# Patient Record
Sex: Female | Born: 1955
Health system: Southern US, Community
[De-identification: ages and names within clinical notes are randomized; demographics above are authoritative.]

---

## 1999-07-11 ENCOUNTER — Other Ambulatory Visit: Admission: RE | Admit: 1999-07-11 | Discharge: 1999-07-11 | Payer: Self-pay | Admitting: *Deleted

## 2000-08-27 ENCOUNTER — Other Ambulatory Visit: Admission: RE | Admit: 2000-08-27 | Discharge: 2000-08-27 | Payer: Self-pay | Admitting: *Deleted

## 2001-09-24 ENCOUNTER — Other Ambulatory Visit: Admission: RE | Admit: 2001-09-24 | Discharge: 2001-09-24 | Payer: Self-pay | Admitting: *Deleted

## 2003-08-26 ENCOUNTER — Other Ambulatory Visit: Admission: RE | Admit: 2003-08-26 | Discharge: 2003-08-26 | Payer: Self-pay | Admitting: Obstetrics and Gynecology

## 2013-02-19 ENCOUNTER — Encounter (HOSPITAL_COMMUNITY): Payer: Self-pay | Admitting: Emergency Medicine

## 2013-02-19 ENCOUNTER — Emergency Department (HOSPITAL_COMMUNITY)
Admission: EM | Admit: 2013-02-19 | Discharge: 2013-02-19 | Disposition: A | Discharge status: Home / Self Care | Payer: No Typology Code available for payment source | Attending: Emergency Medicine | Admitting: Emergency Medicine

## 2013-02-19 DIAGNOSIS — Z79899 Other long term (current) drug therapy: Secondary | ICD-10-CM | POA: Insufficient documentation

## 2013-02-19 DIAGNOSIS — Y92009 Unspecified place in unspecified non-institutional (private) residence as the place of occurrence of the external cause: Secondary | ICD-10-CM | POA: Insufficient documentation

## 2013-02-19 DIAGNOSIS — R296 Repeated falls: Secondary | ICD-10-CM | POA: Insufficient documentation

## 2013-02-19 DIAGNOSIS — Y9389 Activity, other specified: Secondary | ICD-10-CM | POA: Insufficient documentation

## 2013-02-19 DIAGNOSIS — M549 Dorsalgia, unspecified: Secondary | ICD-10-CM

## 2013-02-19 DIAGNOSIS — IMO0002 Reserved for concepts with insufficient information to code with codable children: Secondary | ICD-10-CM | POA: Insufficient documentation

## 2013-02-19 LAB — URINALYSIS, ROUTINE W REFLEX MICROSCOPIC
Bilirubin Urine: NEGATIVE
Glucose, UA: NEGATIVE mg/dL
Hgb urine dipstick: NEGATIVE
Ketones, ur: NEGATIVE mg/dL
Protein, ur: NEGATIVE mg/dL
Urobilinogen, UA: 0.2 mg/dL (ref 0.0–1.0)

## 2013-02-19 LAB — URINE MICROSCOPIC-ADD ON

## 2013-02-19 MED ORDER — IBUPROFEN 600 MG PO TABS: 600.0000 mg | ORAL_TABLET | Freq: Four times a day (QID) | ORAL | Status: DC | PRN

## 2013-02-19 MED ORDER — METHOCARBAMOL 500 MG PO TABS: 500.0000 mg | ORAL_TABLET | Freq: Two times a day (BID) | ORAL | Status: DC

## 2013-02-19 MED ORDER — TRAMADOL HCL 50 MG PO TABS: 50.0000 mg | ORAL_TABLET | Freq: Four times a day (QID) | ORAL | Status: DC | PRN

## 2013-02-19 NOTE — ED Notes (Signed)
Patient states she was awaken at 3am with c/o lower back pain states pain was so severe she became sweaty and diaphortic. States she took 4 ibruprofen and actually now feels much better.

## 2013-02-19 NOTE — ED Notes (Addendum)
PT. REPORTS LEFT LOWER BACK PAIN / STIFFNESS ONSET THIS MORNING , FELL FORWARD WHILE PULLING A SHRUB 1 WEEK AGO , DENIES URINARY DISCOMFORT OR HEMATURIA . TOOK IBUPROFEN WITH TEMPORARY RELIEF.

## 2013-02-19 NOTE — ED Provider Notes (Signed)
Medical screening examination/treatment/procedure(s) were performed by non-physician practitioner and as supervising physician I was immediately available for consultation/collaboration.  Olivia Mackie, MD 02/19/13 [phone]

## 2013-02-19 NOTE — ED Provider Notes (Signed)
CSN: [id number]     Arrival date & time 02/19/13  0405 History     First MD Initiated Contact with Patient 02/19/13 0606     Chief Complaint  Patient presents with  . Back Pain   (Consider location/radiation/quality/duration/timing/severity/associated sxs/prior Treatment) HPI Patient is a 57 year old female who presented to emergency department complaining of back pain. Patient states she fell about a week ago while doing some yard work. States at that time mild pain in the left lower back. States since the fall she has experienced increased pain in the left lower back in the mornings only. States usually when wakes up since 6 AM her back pain is severe. States it improves as the day goes on. States she's taking ibuprofen but good relief. Patient admits that she's been playing tennis with injured back which she states "maybe I shouldn't be." Patient states that the pain does not radiate into her extremities. Patient denies any weakness or numbness in extremities. Patient denies any settle anesthesia. Patient denies any problems with urine or bowel control. Patient denies any fever, chills, malaise. Patient denies any abdominal pain. States this morning was waking up from sleep at 2 AM with increased pain. States at that time she had trouble moving and states she panicked and came to emergency department. Prior to coming in she took 800 mg of ibuprofen and her pain has resolved since she's been here. History reviewed. No pertinent past medical history. Past Surgical History  Procedure Laterality Date  . Cesarean section     No family history on file. History  Substance Use Topics  . Smoking status: Never Smoker   . Smokeless tobacco: Not on file  . Alcohol Use: No   OB History   Grav Para Term Preterm Abortions TAB SAB Ect Mult Living                 Review of Systems  Constitutional: Negative for fever and chills.  HENT: Negative for neck pain and neck stiffness.   Respiratory:  Negative.   Cardiovascular: Negative.   Genitourinary: Negative for dysuria, hematuria, flank pain, decreased urine volume, vaginal bleeding, vaginal discharge and pelvic pain.  Musculoskeletal: Negative for back pain.  Skin: Negative.   Neurological: Negative for weakness and numbness.    Allergies  Review of patient's allergies indicates no known allergies.  Home Medications   Current Outpatient Rx  Name  Route  Sig  Dispense  Refill  . buPROPion (WELLBUTRIN XL) 150 MG 24 hr tablet   Oral   Take 150 mg by mouth daily.         . cetirizine (ZYRTEC) 10 MG tablet   Oral   Take 10 mg by mouth daily.          BP 142/64  Temp(Src) 97.9 F (36.6 C) (Oral)  Resp 18  SpO2 100% Physical Exam  Nursing note and vitals reviewed. Constitutional: She appears well-developed and well-nourished. No distress.  HENT:  Head: Normocephalic.  Eyes: Conjunctivae are normal.  Neck: Neck supple.  Cardiovascular: Normal rate, regular rhythm and normal heart sounds.   Pulmonary/Chest: Effort normal and breath sounds normal. No respiratory distress. She has no wheezes. She has no rales.  Abdominal: Soft. Bowel sounds are normal. She exhibits no distension. There is no tenderness. There is no rebound.  Musculoskeletal: She exhibits no edema.  No midline or paravertebral lumbar spine tenderness. No CVA tenderness. Pain with left straight leg raise.  Neurological: She is alert.  5/5 and  equal lower extremity strength. 2+ and equal patellar reflexes bilaterally. Pt able to dorsiflex bilateral toes and feet with good strength against resistance. Equal sensation bilaterally over thighs and lower legs. Normal gait  Skin: Skin is warm and dry.  Psychiatric: She has a normal mood and affect. Her behavior is normal.    ED Course   Procedures (including critical care time)  Labs Reviewed  URINALYSIS, ROUTINE W REFLEX MICROSCOPIC - Abnormal; Notable for the following:    APPearance CLOUDY (*)     Leukocytes, UA MODERATE (*)    All other components within normal limits  URINE MICROSCOPIC-ADD ON - Abnormal; Notable for the following:    Crystals CA OXALATE CRYSTALS (*)    All other components within normal limits  URINE CULTURE   No results found.  1. Back pain     MDM  Patient's back pain is most likely musculoskeletal based on the exam and the fact that she has worse pain with movement. Patient's pain has resolved with ibuprofen. Have discussed with patient possibility of a kidney stone, she denies any history of kidney stones. Time she is pain-free and does not wish to have anymore testing and wants to go home. UA does show moderate leukocyte esterase 7-10 white blood cells and calcium oxalate crystals. This is consistent with possible kidney stone versus infection. There are rare bacteria in urine. Cultures are sounds and is abnormal she will be called in an antibiotic. At this time patient just wants to be discharged home. Will try a muscle relaxant and pain medication and home with close followup.  Lottie Mussel, PA-C 02/19/13 [phone]

## 2013-02-20 LAB — URINE CULTURE
Colony Count: NO GROWTH
Culture: NO GROWTH

## 2015-07-21 ENCOUNTER — Ambulatory Visit (INDEPENDENT_AMBULATORY_CARE_PROVIDER_SITE_OTHER): Payer: 59 | Admitting: Podiatry

## 2015-07-21 ENCOUNTER — Encounter: Payer: Self-pay | Admitting: Podiatry

## 2015-07-21 VITALS — BP 162/88 | HR 77 | Resp 16

## 2015-07-21 DIAGNOSIS — L603 Nail dystrophy: Secondary | ICD-10-CM

## 2015-07-21 NOTE — Progress Notes (Signed)
   Subjective:    Patient ID: Alexis Bullock, female    DOB: [birth year], 60 y.o.   MRN: [id number]  HPI: She presents today with a chief complaint of possible fungus to the hallux and fifth digit of the right foot. She states that this is been present for several years. Has seen Dr. Sherilyn Cooter in the past for fungus to the bilateral foot and was provided a prescription for Lamisil which cleared the left foot but did nothing for the right foot.    Review of Systems  All other systems reviewed and are negative.      Objective:   Physical Exam: 60 year old female vital signs stable alert and oriented 3. Pulses are palpable. Neurologic sensorium is intact. Deep tendon reflexes are intact bilateral and muscle strength +5 over 5 dorsiflexion plantar flexors and inverters everters all intrinsic musculature is intact. Orthopedic evaluation demonstrate all joints distal to the ankle have a full range of motion without crepitation. Cutaneous evaluation demonstrates supple well-hydrated cutis no erythema edema cellulitis drainage or odor. The nail plates do demonstrate nail dystrophy and some possible onychomycosis. There does seem to be some subungual debris but no delamination.        Assessment & Plan:  Nail dystrophy hallux and fifth digits right foot.  Plan: Samples of the skin and nail were taken today to be sent for pathologic evaluation. We will notify her with the results as they return.

## 2015-08-10 ENCOUNTER — Telehealth: Payer: Self-pay | Admitting: *Deleted

## 2015-08-10 NOTE — Telephone Encounter (Addendum)
Pt states Dr. Al Corpus recommended she call our office 3 weeks after last visit for fungal culture results.  I faxed results to Dr. Al Corpus. 08/10/2015-DR. HYATT REVIEWED 07/21/2015 fungal culture as negative for fungus and recommended Revitaderm40.  Informed pt of results and recommendation and pt states she will pick up the Revitaderm40 tomorrow.

## 2015-08-12 DIAGNOSIS — L603 Nail dystrophy: Secondary | ICD-10-CM

## 2016-04-19 DIAGNOSIS — J069 Acute upper respiratory infection, unspecified: Secondary | ICD-10-CM | POA: Diagnosis not present

## 2016-05-30 DIAGNOSIS — Z23 Encounter for immunization: Secondary | ICD-10-CM | POA: Diagnosis not present

## 2016-09-27 DIAGNOSIS — R350 Frequency of micturition: Secondary | ICD-10-CM | POA: Diagnosis not present

## 2016-10-03 DIAGNOSIS — N202 Calculus of kidney with calculus of ureter: Secondary | ICD-10-CM | POA: Diagnosis not present

## 2016-10-03 DIAGNOSIS — N200 Calculus of kidney: Secondary | ICD-10-CM | POA: Diagnosis not present

## 2016-12-25 DIAGNOSIS — Z0000 Encounter for general adult medical examination without abnormal findings: Secondary | ICD-10-CM | POA: Diagnosis not present

## 2016-12-26 DIAGNOSIS — Z0000 Encounter for general adult medical examination without abnormal findings: Secondary | ICD-10-CM | POA: Diagnosis not present

## 2016-12-26 DIAGNOSIS — Z13220 Encounter for screening for lipoid disorders: Secondary | ICD-10-CM | POA: Diagnosis not present

## 2017-02-05 ENCOUNTER — Ambulatory Visit (INDEPENDENT_AMBULATORY_CARE_PROVIDER_SITE_OTHER): Payer: Self-pay

## 2017-02-05 ENCOUNTER — Ambulatory Visit (INDEPENDENT_AMBULATORY_CARE_PROVIDER_SITE_OTHER): Payer: BLUE CROSS/BLUE SHIELD | Admitting: Orthopedic Surgery

## 2017-02-05 VITALS — Ht 65.0 in | Wt 145.0 lb

## 2017-02-05 DIAGNOSIS — G8929 Other chronic pain: Secondary | ICD-10-CM

## 2017-02-05 DIAGNOSIS — M25511 Pain in right shoulder: Secondary | ICD-10-CM

## 2017-02-05 MED ORDER — METHYLPREDNISOLONE ACETATE 40 MG/ML IJ SUSP
40.0000 mg | INTRAMUSCULAR | Status: AC | PRN
  Administered 2017-02-05: 40 mg via INTRA_ARTICULAR

## 2017-02-05 MED ORDER — LIDOCAINE HCL 1 % IJ SOLN
5.0000 mL | INTRAMUSCULAR | Status: AC | PRN
  Administered 2017-02-05: 5 mL

## 2017-02-05 NOTE — Progress Notes (Signed)
Office Visit Note   Patient: Alexis Bullock           Date of Birth: [birth year]           MRN: [id number] Visit Date: 02/05/2017              Requested by: Merri Brunette, MD [id number] [place], [place] [address] PCP: Merri Brunette, MD  Chief Complaint  Patient presents with  . Right Shoulder - Pain    S/p fall about 6 weeks ago      HPI: Patient is a 61 year old woman who has been having impingement symptoms of her right shoulder she has pain with trying to get her arm overhead wall playing tennis pain at night pain radiates down into the biceps region. Patient states she initially fell on an outstretched arm and this is what caused her problems. Patient has undergone months of conservative therapy without relief of her symptoms.  Assessment & Plan: Visit Diagnoses:  1. Chronic right shoulder pain     Plan: Shoulder was injected from the posterior portal follow-up as needed. Discussed that if she has recurrent symptoms we can proceed with an additional injection if the injection provides no relief we would obtain an MRI scan to further evaluate the integrity of the rotator cuff.  Follow-Up Instructions: Return if symptoms worsen or fail to improve.   Ortho Exam  Patient is alert, oriented, no adenopathy, well-dressed, normal affect, normal respiratory effort. Examination patient has a normal gait. She has abduction and flexion 120. She has pain with Neer and Hawkins impingement test pain with drop arm test. The biceps tendon is nontender to palpation.  Imaging: Xr Shoulder Right  Result Date: 02/05/2017 Three-view radiographs of the right shoulder shows decrease of joint space narrowing slightly hoped acromion with a congruent glenohumeral joint. No masses in the lung field.   Labs: Lab Results  Component Value Date   REPTSTATUS 02/20/2013 FINAL 02/19/2013   CULT NO GROWTH Performed at [facility] 02/19/2013    Orders:  Orders Placed This  Encounter  Procedures  . XR Shoulder Right   No orders of the defined types were placed in this encounter.    Procedures: Large Joint Inj Date/Time: 02/05/2017 2:35 PM Performed by: Terreon Ekholm V Authorized by: Nadara Mustard   Consent Given by:  Patient Site marked: the procedure site was marked   Timeout: prior to procedure the correct patient, procedure, and site was verified   Indications:  Pain and diagnostic evaluation Location:  Shoulder Site:  R subacromial bursa Prep: patient was prepped and draped in usual sterile fashion   Needle Size:  22 G Needle Length:  1.5 inches Approach:  Posterior Ultrasound Guidance: No   Fluoroscopic Guidance: No   Arthrogram: No   Medications:  5 mL lidocaine 1 %; 40 mg methylPREDNISolone acetate 40 MG/ML Aspiration Attempted: No   Patient tolerance:  Patient tolerated the procedure well with no immediate complications    Clinical Data: No additional findings.  ROS:  All other systems negative, except as noted in the HPI. Review of Systems  Objective: Vital Signs: Ht 5\' 5"  (1.651 m)   Wt 145 lb (65.8 kg)   BMI 24.13 kg/m   Specialty Comments:  No specialty comments available.  PMFS History: There are no active problems to display for this patient.  No past medical history on file.  No family history on file.  Past Surgical History:  Procedure Laterality Date  .  CESAREAN SECTION     Social History   Occupational History  . Not on file.   Social History Main Topics  . Smoking status: Never Smoker  . Smokeless tobacco: Not on file  . Alcohol use No  . Drug use: No  . Sexual activity: Not on file

## 2017-03-11 ENCOUNTER — Telehealth (INDEPENDENT_AMBULATORY_CARE_PROVIDER_SITE_OTHER): Payer: Self-pay

## 2017-03-11 DIAGNOSIS — G8929 Other chronic pain: Secondary | ICD-10-CM

## 2017-03-11 DIAGNOSIS — M25511 Pain in right shoulder: Principal | ICD-10-CM

## 2017-03-11 NOTE — Telephone Encounter (Addendum)
Patient called stating that she is having Bicep and Tricep pain in her right arm.  Would like to know if she could get a Rx for pain?  CB# is [phone].  Please advise. Thank You.

## 2017-03-12 MED ORDER — NABUMETONE 750 MG PO TABS: 750.0000 mg | ORAL_TABLET | Freq: Two times a day (BID) | ORAL | 0 refills | Status: DC

## 2017-03-12 NOTE — Addendum Note (Signed)
Addended by: Diona FoleyPEELE, STEPHENEY L on: 03/12/2017 10:14 AM   Modules accepted: Orders

## 2017-03-12 NOTE — Telephone Encounter (Signed)
I called and spoke with patient advised we would send in relafen to her pharmacy and order MRI. Advised we would need to obtain insurance approval before she can be scheduled. She is changing insurance on 04/01/17 with her employer. Advised hopefully we can have her scheduled before this date. She is claustrophobic and requests an open scanner.

## 2017-03-12 NOTE — Telephone Encounter (Signed)
Call patient we can: Prescription for Relafen 750 mg twice a day and set her up for an MRI scan of her shoulder to rule out rotator cuff tear.

## 2017-03-13 ENCOUNTER — Ambulatory Visit (INDEPENDENT_AMBULATORY_CARE_PROVIDER_SITE_OTHER): Payer: BLUE CROSS/BLUE SHIELD | Admitting: Orthopedic Surgery

## 2017-03-18 DIAGNOSIS — Z1231 Encounter for screening mammogram for malignant neoplasm of breast: Secondary | ICD-10-CM | POA: Diagnosis not present

## 2017-03-27 DIAGNOSIS — Z6825 Body mass index (BMI) 25.0-25.9, adult: Secondary | ICD-10-CM | POA: Diagnosis not present

## 2017-03-27 DIAGNOSIS — Z01419 Encounter for gynecological examination (general) (routine) without abnormal findings: Secondary | ICD-10-CM | POA: Diagnosis not present

## 2017-04-03 ENCOUNTER — Ambulatory Visit
Admission: RE | Admit: 2017-04-03 | Discharge: 2017-04-03 | Disposition: A | Discharge status: Home / Self Care | Payer: BLUE CROSS/BLUE SHIELD | Source: Ambulatory Visit | Attending: Orthopedic Surgery | Admitting: Orthopedic Surgery

## 2017-04-03 DIAGNOSIS — M25511 Pain in right shoulder: Principal | ICD-10-CM

## 2017-04-03 DIAGNOSIS — G8929 Other chronic pain: Secondary | ICD-10-CM

## 2017-04-04 ENCOUNTER — Encounter (INDEPENDENT_AMBULATORY_CARE_PROVIDER_SITE_OTHER): Payer: Self-pay | Admitting: Orthopedic Surgery

## 2017-04-04 ENCOUNTER — Ambulatory Visit (INDEPENDENT_AMBULATORY_CARE_PROVIDER_SITE_OTHER): Payer: BLUE CROSS/BLUE SHIELD | Admitting: Orthopedic Surgery

## 2017-04-04 DIAGNOSIS — M7541 Impingement syndrome of right shoulder: Secondary | ICD-10-CM

## 2017-04-04 NOTE — Progress Notes (Signed)
Office Visit Note   Patient: Alexis Bullock           Date of Birth: Feb 22, 1956           MRN: [id number] Visit Date: 04/04/2017              Requested by: Merri Brunette, MD [phone] [place], [place] [address] PCP: Merri Brunette, MD  Chief Complaint  Patient presents with  . Results    Rt shoulder MRI      HPI: Patient is a 61 year old woman who has not played tennis since her last examination she states her shoulder is feeling fine she has been doing scapular stabilization exercises but no others. She states the pain is primarily in the biceps region she states she can sleep now.  Assessment & Plan: Visit Diagnoses:  1. Impingement syndrome of right shoulder     Plan: Recommended internal and external rotation strengthening and scapular stabilization. This was demonstrated to her. She states she will resume tennis and follow-up as needed.  Follow-Up Instructions: Return if symptoms worsen or fail to improve.   Ortho Exam  Patient is alert, oriented, no adenopathy, well-dressed, normal affect, normal respiratory effort. Examination patient has full range of motion of both shoulders she has no pain with Neer and Hawkins impingement test biceps tendon is nontender to palpation. Review of the MRI scan shows a tiny partial-thickness tearing of the rotator cuff very mild subacromial bursitis.  Imaging: Mr Shoulder Right Wo Contrast  Result Date: 04/03/2017 CLINICAL DATA:  Right shoulder pain since a fall 3 months ago. EXAM: MRI OF THE RIGHT SHOULDER WITHOUT CONTRAST TECHNIQUE: Multiplanar, multisequence MR imaging of the shoulder was performed. No intravenous contrast was administered. COMPARISON:  None. FINDINGS: Rotator cuff: There is a 3 x 5 mm partial-thickness articular surface tear of the distal supraspinous tendon is seen on image 10 of series 6 and image 14 of series 4. The rotator cuff otherwise appears normal. Muscles: No atrophy or abnormal signal of  the muscles of the rotator cuff. Biceps long head:  Properly located and intact. Acromioclavicular Joint: Normal. Type 1 acromion. Tiny amount of fluid in the subacromial bursa. Glenohumeral Joint: Normal. Labrum:  Normal. Bones:  Normal. Other: None IMPRESSION: 1. Tiny focal articular surface tear of the distal supraspinous tendon. 2. Minimal subacromial bursitis. Electronically Signed   By: Francene Boyers M.D.   On: 04/03/2017 11:33   No images are attached to the encounter.  Labs: Lab Results  Component Value Date   REPTSTATUS 02/20/2013 FINAL 02/19/2013   CULT NO GROWTH Performed at [facility] 02/19/2013    Orders:  No orders of the defined types were placed in this encounter.  No orders of the defined types were placed in this encounter.    Procedures: No procedures performed  Clinical Data: No additional findings.  ROS:  All other systems negative, except as noted in the HPI. Review of Systems  Objective: Vital Signs: There were no vitals taken for this visit.  Specialty Comments:  No specialty comments available.  PMFS History: There are no active problems to display for this patient.  No past medical history on file.  No family history on file.  Past Surgical History:  Procedure Laterality Date  . CESAREAN SECTION     Social History   Occupational History  . Not on file.   Social History Main Topics  . Smoking status: Never Smoker  . Smokeless tobacco: Never Used  .  Alcohol use No  . Drug use: No  . Sexual activity: Not on file

## 2017-04-07 ENCOUNTER — Other Ambulatory Visit (INDEPENDENT_AMBULATORY_CARE_PROVIDER_SITE_OTHER): Payer: Self-pay | Admitting: Orthopedic Surgery

## 2017-04-09 DIAGNOSIS — M8589 Other specified disorders of bone density and structure, multiple sites: Secondary | ICD-10-CM | POA: Diagnosis not present

## 2017-05-06 DIAGNOSIS — R3915 Urgency of urination: Secondary | ICD-10-CM | POA: Diagnosis not present

## 2017-11-04 DIAGNOSIS — L237 Allergic contact dermatitis due to plants, except food: Secondary | ICD-10-CM | POA: Diagnosis not present

## 2017-11-12 ENCOUNTER — Ambulatory Visit: Payer: BLUE CROSS/BLUE SHIELD | Admitting: Podiatry

## 2017-11-12 ENCOUNTER — Other Ambulatory Visit: Payer: Self-pay | Admitting: Podiatry

## 2017-11-12 ENCOUNTER — Encounter: Payer: Self-pay | Admitting: Podiatry

## 2017-11-12 ENCOUNTER — Ambulatory Visit: Payer: BLUE CROSS/BLUE SHIELD

## 2017-11-12 ENCOUNTER — Other Ambulatory Visit: Payer: Self-pay

## 2017-11-12 DIAGNOSIS — M21621 Bunionette of right foot: Secondary | ICD-10-CM | POA: Diagnosis not present

## 2017-11-12 DIAGNOSIS — M2011 Hallux valgus (acquired), right foot: Secondary | ICD-10-CM

## 2017-11-12 NOTE — Progress Notes (Signed)
She presents today after having not seen her for a couple years with a chief concern of a painful knot to the dorsal lateral aspect of the fifth metatarsal phalangeal joint of the right foot.  States that she wore a pair shoes one day and after that the foot was sore in that area but has since calmed down considerably.  Objective: Vital signs are stable she is alert and oriented x3 palpable pulses right foot.  Neurologic sensorium is intact.  Evaluation of fifth metatarsophalangeal joint area does demonstrate a mild tailor's bunion deformity.  No erythema sialitis drainage or odor no pain on palpation of the area or range of motion of the fifth toe.  Assessment: Capsulitis and tailor's bunion deformity.  Plan: Discussed etiology pathology conservative surgical therapy at this point told her to follow-up with me should this recur or become more painful we will put her shot injury at that time.

## 2017-12-10 DIAGNOSIS — L03115 Cellulitis of right lower limb: Secondary | ICD-10-CM | POA: Diagnosis not present

## 2017-12-10 DIAGNOSIS — W57XXXA Bitten or stung by nonvenomous insect and other nonvenomous arthropods, initial encounter: Secondary | ICD-10-CM | POA: Diagnosis not present

## 2017-12-30 DIAGNOSIS — E7800 Pure hypercholesterolemia, unspecified: Secondary | ICD-10-CM | POA: Diagnosis not present

## 2017-12-30 DIAGNOSIS — N649 Disorder of breast, unspecified: Secondary | ICD-10-CM | POA: Diagnosis not present

## 2017-12-30 DIAGNOSIS — Z0000 Encounter for general adult medical examination without abnormal findings: Secondary | ICD-10-CM | POA: Diagnosis not present

## 2018-01-31 DIAGNOSIS — L720 Epidermal cyst: Secondary | ICD-10-CM | POA: Diagnosis not present

## 2018-01-31 DIAGNOSIS — L814 Other melanin hyperpigmentation: Secondary | ICD-10-CM | POA: Diagnosis not present

## 2018-02-13 DIAGNOSIS — L237 Allergic contact dermatitis due to plants, except food: Secondary | ICD-10-CM | POA: Diagnosis not present

## 2018-07-08 DIAGNOSIS — Z1151 Encounter for screening for human papillomavirus (HPV): Secondary | ICD-10-CM | POA: Diagnosis not present

## 2018-07-08 DIAGNOSIS — Z6825 Body mass index (BMI) 25.0-25.9, adult: Secondary | ICD-10-CM | POA: Diagnosis not present

## 2018-07-08 DIAGNOSIS — Z01419 Encounter for gynecological examination (general) (routine) without abnormal findings: Secondary | ICD-10-CM | POA: Diagnosis not present

## 2018-07-08 DIAGNOSIS — Z113 Encounter for screening for infections with a predominantly sexual mode of transmission: Secondary | ICD-10-CM | POA: Diagnosis not present

## 2019-01-15 DIAGNOSIS — U071 COVID-19: Secondary | ICD-10-CM | POA: Diagnosis not present

## 2019-01-20 DIAGNOSIS — E7800 Pure hypercholesterolemia, unspecified: Secondary | ICD-10-CM | POA: Diagnosis not present

## 2019-01-20 DIAGNOSIS — Z0000 Encounter for general adult medical examination without abnormal findings: Secondary | ICD-10-CM | POA: Diagnosis not present

## 2019-07-28 DIAGNOSIS — Z01419 Encounter for gynecological examination (general) (routine) without abnormal findings: Secondary | ICD-10-CM | POA: Diagnosis not present

## 2019-07-28 DIAGNOSIS — Z6825 Body mass index (BMI) 25.0-25.9, adult: Secondary | ICD-10-CM | POA: Diagnosis not present

## 2019-09-09 DIAGNOSIS — M8589 Other specified disorders of bone density and structure, multiple sites: Secondary | ICD-10-CM | POA: Diagnosis not present

## 2019-09-09 DIAGNOSIS — Z1231 Encounter for screening mammogram for malignant neoplasm of breast: Secondary | ICD-10-CM | POA: Diagnosis not present

## 2019-10-31 ENCOUNTER — Other Ambulatory Visit: Payer: Self-pay

## 2019-10-31 ENCOUNTER — Ambulatory Visit (HOSPITAL_COMMUNITY)
Admission: EM | Admit: 2019-10-31 | Discharge: 2019-10-31 | Disposition: A | Discharge status: Home / Self Care | Payer: BC Managed Care – PPO

## 2019-10-31 ENCOUNTER — Encounter (HOSPITAL_COMMUNITY): Payer: Self-pay | Admitting: *Deleted

## 2019-10-31 DIAGNOSIS — L2489 Irritant contact dermatitis due to other agents: Secondary | ICD-10-CM | POA: Diagnosis not present

## 2019-10-31 MED ORDER — PREDNISONE 20 MG PO TABS: 40.0000 mg | ORAL_TABLET | Freq: Every day | ORAL | 0 refills | Status: AC

## 2019-10-31 NOTE — ED Triage Notes (Addendum)
Pt reports doing yardwork 5 days ago; states started with pruritic localized rash to left cheek, and then to left torso; pt believes she was exposed to poison oak.  Has been applying Benadryl spray and hydrocortisone cream. States received [employer] vaccine 3 days ago.

## 2019-10-31 NOTE — Discharge Instructions (Signed)
Begin prednisone daily for the next week, take with food and in the morning Continue antihistamines-Zyrtec/Claritin in the morning, Benadryl at nighttime For further itching May continue to apply topical medicines, oatmeal baths, Aveeno  Please follow-up if rash not improving or worsening

## 2019-11-01 NOTE — ED Provider Notes (Signed)
[facility]    CSN: [id number] Arrival date & time: 10/31/19  1049      History   Chief Complaint Chief Complaint  Patient presents with  . Rash    HPI Alexis Bullock is a 64 y.o. female no significant past medical history presenting today for evaluation of a rash.  Began to break out in a rash approximately 5 days ago.  Rashes associated with itching.  Feels very similar to prior breakout she has had with poison ivy.  Noting rash to left cheek, chest as well as lower left abdomen wrapping around waistline.  She has been applying topical medicines.  She also reports that she received her [employer] Covid vaccine 3 days ago, but symptoms began prior.  Denies difficulty breathing or shortness of breath.  HPI  History reviewed. No pertinent past medical history.  There are no problems to display for this patient.   Past Surgical History:  Procedure Laterality Date  . CESAREAN SECTION      OB History   No obstetric history on file.      Home Medications    Prior to Admission medications   Medication Sig Start Date End Date Taking? Authorizing Provider  ASPIRIN 81 PO Take by mouth.   Yes [provider]  buPROPion (WELLBUTRIN XL) 300 MG 24 hr tablet  11/04/17  Yes [provider]  CALCIUM PO Take by mouth.   Yes [provider]  cetirizine (ZYRTEC) 10 MG tablet Take 10 mg by mouth daily.   Yes [provider]  predniSONE (DELTASONE) 20 MG tablet Take 2 tablets (40 mg total) by mouth daily with breakfast for 7 days. 10/31/19 11/07/19  Juanjesus Pepperman, Junius Creamer, PA-C    Family History Family History  Problem Relation Age of Onset  . Cancer Mother   . Kidney disease Father     Social History Social History   Tobacco Use  . Smoking status: Never Smoker  . Smokeless tobacco: Never Used  Substance Use Topics  . Alcohol use: No  . Drug use: No     Allergies   Patient has no known allergies.   Review of Systems Review  of Systems  Constitutional: Negative for fatigue and fever.  Eyes: Negative for visual disturbance.  Respiratory: Negative for shortness of breath.   Cardiovascular: Negative for chest pain.  Gastrointestinal: Negative for abdominal pain, nausea and vomiting.  Musculoskeletal: Negative for arthralgias and joint swelling.  Skin: Positive for color change and rash. Negative for wound.  Neurological: Negative for dizziness, weakness, light-headedness and headaches.     Physical Exam Triage Vital Signs ED Triage Vitals  Enc Vitals Group     BP 10/31/19 1117 (!) 167/86     Pulse Rate 10/31/19 1117 86     Resp 10/31/19 1117 16     Temp 10/31/19 1117 98.1 F (36.7 C)     Temp Source 10/31/19 1117 Oral     SpO2 10/31/19 1117 100 %     Weight --      Height --      Head Circumference --      Peak Flow --      Pain Score 10/31/19 1118 0     Pain Loc --      Pain Edu? --      Excl. in GC? --    No data found.  Updated Vital Signs BP (!) 167/86   Pulse 86   Temp 98.1 F (36.7  C) (Oral)   Resp 16   SpO2 100%   Visual Acuity Right Eye Distance:   Left Eye Distance:   Bilateral Distance:    Right Eye Near:   Left Eye Near:    Bilateral Near:     Physical Exam Vitals and nursing note reviewed.  Constitutional:      Appearance: She is well-developed.     Comments: No acute distress  HENT:     Head: Normocephalic and atraumatic.     Nose: Nose normal.     Mouth/Throat:     Comments: Oral mucosa pink and moist, no tonsillar enlargement or exudate. Posterior pharynx patent and nonerythematous, no uvula deviation or swelling. Normal phonation. No lesions on oral mucosa Eyes:     Conjunctiva/sclera: Conjunctivae normal.  Cardiovascular:     Rate and Rhythm: Normal rate.  Pulmonary:     Effort: Pulmonary effort is normal. No respiratory distress.     Comments: Breathing comfortably at rest, CTABL, no wheezing, rales or other adventitious sounds auscultated Abdominal:      General: There is no distension.  Musculoskeletal:        General: Normal range of motion.     Cervical back: Neck supple.  Skin:    General: Skin is warm and dry.     Comments: Left lower face to face clustered area of erythematous papules, does not extend towards eyes  Chest with maculopapular rash noted most prominently on right side  Left lower belt line with erythematous patch extending around to flank to back  Central abdomen with small linear erythematous area  Rash is blanchable  Neurological:     Mental Status: She is alert and oriented to person, place, and time.      UC Treatments / Results  Labs (all labs ordered are listed, but only abnormal results are displayed) Labs Reviewed - No data to display  EKG   Radiology No results found.  Procedures Procedures (including critical care time)  Medications Ordered in UC Medications - No data to display  Initial Impression / Assessment and Plan / UC Course  I have reviewed the triage vital signs and the nursing notes.  Pertinent labs & imaging results that were available during my care of the patient were reviewed by me and considered in my medical decision making (see chart for details).     Rashes consistent with likely contact dermatitis.  No airway involvement.  Adding in prednisone to further help with symptoms/rash.  Continue antihistamines and topical measures.  Follow-up if any symptoms not improving or worsening.  Discussed strict return precautions. Patient verbalized understanding and is agreeable with plan.  Final Clinical Impressions(s) / UC Diagnoses   Final diagnoses:  Irritant contact dermatitis due to other agents     Discharge Instructions     Begin prednisone daily for the next week, take with food and in the morning Continue antihistamines-Zyrtec/Claritin in the morning, Benadryl at nighttime For further itching May continue to apply topical medicines, oatmeal baths,  Aveeno  Please follow-up if rash not improving or worsening   ED Prescriptions    Medication Sig Dispense Auth. Provider   predniSONE (DELTASONE) 20 MG tablet Take 2 tablets (40 mg total) by mouth daily with breakfast for 7 days. 14 tablet Nicholle Falzon, [place] C, PA-C     PDMP not reviewed this encounter.   Janith Lima, [place] 11/01/19 [phone]

## 2019-11-09 DIAGNOSIS — L237 Allergic contact dermatitis due to plants, except food: Secondary | ICD-10-CM | POA: Diagnosis not present

## 2019-12-24 ENCOUNTER — Other Ambulatory Visit: Payer: Self-pay | Admitting: Family Medicine

## 2019-12-24 DIAGNOSIS — R1011 Right upper quadrant pain: Secondary | ICD-10-CM | POA: Diagnosis not present

## 2019-12-24 DIAGNOSIS — R030 Elevated blood-pressure reading, without diagnosis of hypertension: Secondary | ICD-10-CM | POA: Diagnosis not present

## 2019-12-25 ENCOUNTER — Ambulatory Visit
Admission: RE | Admit: 2019-12-25 | Discharge: 2019-12-25 | Disposition: A | Discharge status: Home / Self Care | Payer: BC Managed Care – PPO | Source: Ambulatory Visit | Attending: Family Medicine | Admitting: Family Medicine

## 2019-12-25 ENCOUNTER — Other Ambulatory Visit: Payer: Self-pay

## 2019-12-25 DIAGNOSIS — R1011 Right upper quadrant pain: Secondary | ICD-10-CM

## 2019-12-25 DIAGNOSIS — K8020 Calculus of gallbladder without cholecystitis without obstruction: Secondary | ICD-10-CM | POA: Diagnosis not present

## 2020-01-08 DIAGNOSIS — K8020 Calculus of gallbladder without cholecystitis without obstruction: Secondary | ICD-10-CM | POA: Diagnosis not present

## 2020-02-02 DIAGNOSIS — L723 Sebaceous cyst: Secondary | ICD-10-CM | POA: Diagnosis not present

## 2020-02-02 DIAGNOSIS — Z0000 Encounter for general adult medical examination without abnormal findings: Secondary | ICD-10-CM | POA: Diagnosis not present

## 2020-02-02 DIAGNOSIS — E7800 Pure hypercholesterolemia, unspecified: Secondary | ICD-10-CM | POA: Diagnosis not present

## 2020-03-12 DIAGNOSIS — Z20828 Contact with and (suspected) exposure to other viral communicable diseases: Secondary | ICD-10-CM | POA: Diagnosis not present

## 2020-07-15 DIAGNOSIS — Z01818 Encounter for other preprocedural examination: Secondary | ICD-10-CM | POA: Diagnosis not present

## 2020-07-21 DIAGNOSIS — L720 Epidermal cyst: Secondary | ICD-10-CM | POA: Diagnosis not present

## 2020-07-21 DIAGNOSIS — K8010 Calculus of gallbladder with chronic cholecystitis without obstruction: Secondary | ICD-10-CM | POA: Diagnosis not present

## 2020-07-21 DIAGNOSIS — L728 Other follicular cysts of the skin and subcutaneous tissue: Secondary | ICD-10-CM | POA: Diagnosis not present

## 2020-07-21 DIAGNOSIS — L729 Follicular cyst of the skin and subcutaneous tissue, unspecified: Secondary | ICD-10-CM | POA: Diagnosis not present

## 2020-10-04 DIAGNOSIS — Z1231 Encounter for screening mammogram for malignant neoplasm of breast: Secondary | ICD-10-CM | POA: Diagnosis not present

## 2020-10-12 DIAGNOSIS — R922 Inconclusive mammogram: Secondary | ICD-10-CM | POA: Diagnosis not present

## 2020-10-12 DIAGNOSIS — R928 Other abnormal and inconclusive findings on diagnostic imaging of breast: Secondary | ICD-10-CM | POA: Diagnosis not present

## 2020-10-12 DIAGNOSIS — R921 Mammographic calcification found on diagnostic imaging of breast: Secondary | ICD-10-CM | POA: Diagnosis not present

## 2020-10-24 DIAGNOSIS — Z6825 Body mass index (BMI) 25.0-25.9, adult: Secondary | ICD-10-CM | POA: Diagnosis not present

## 2020-10-24 DIAGNOSIS — Z01419 Encounter for gynecological examination (general) (routine) without abnormal findings: Secondary | ICD-10-CM | POA: Diagnosis not present

## 2020-10-26 DIAGNOSIS — R921 Mammographic calcification found on diagnostic imaging of breast: Secondary | ICD-10-CM | POA: Diagnosis not present

## 2020-10-26 DIAGNOSIS — D242 Benign neoplasm of left breast: Secondary | ICD-10-CM | POA: Diagnosis not present

## 2021-02-05 IMAGING — US US ABDOMEN LIMITED
1 series · 14 of 25 positions shown · non-contrast
Comparison: 10/03/2016 unenhanced CT abdomen/pelvis.

CLINICAL DATA: Right upper quadrant abdominal pain for 4 days

EXAM:
ULTRASOUND ABDOMEN LIMITED RIGHT UPPER QUADRANT

[Series 1: us abdomen limited · 0.19mm/px · 14 of 40 slices shown]
[im 1/40]
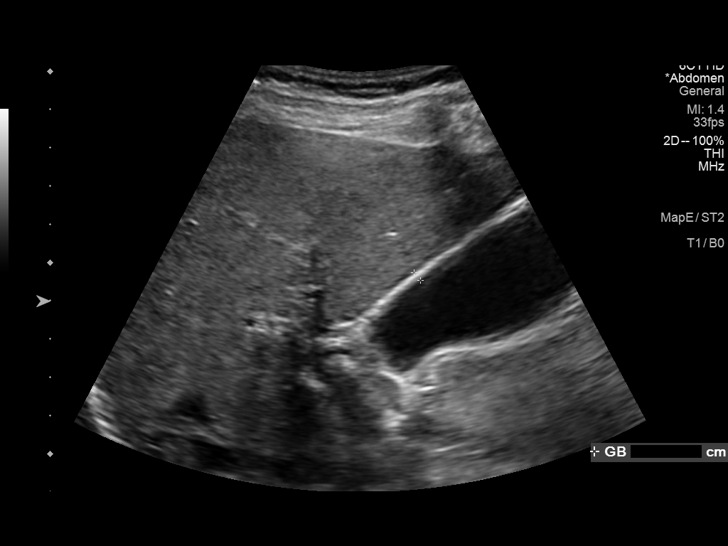
[im 4/40]
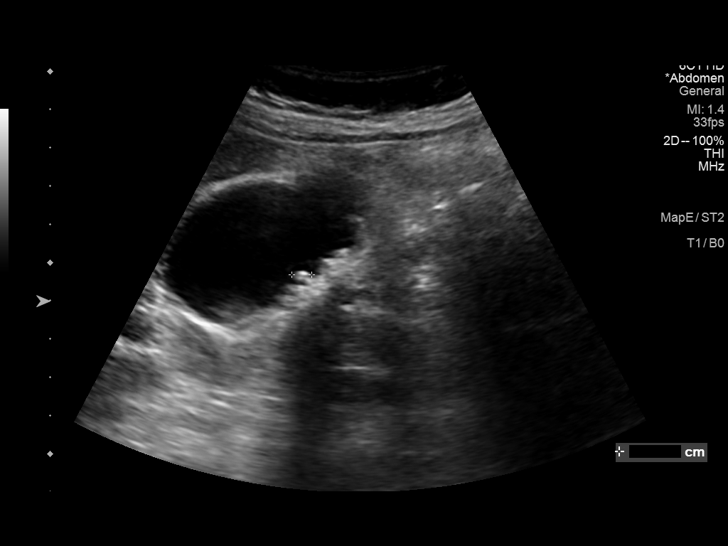
[im 7/40]
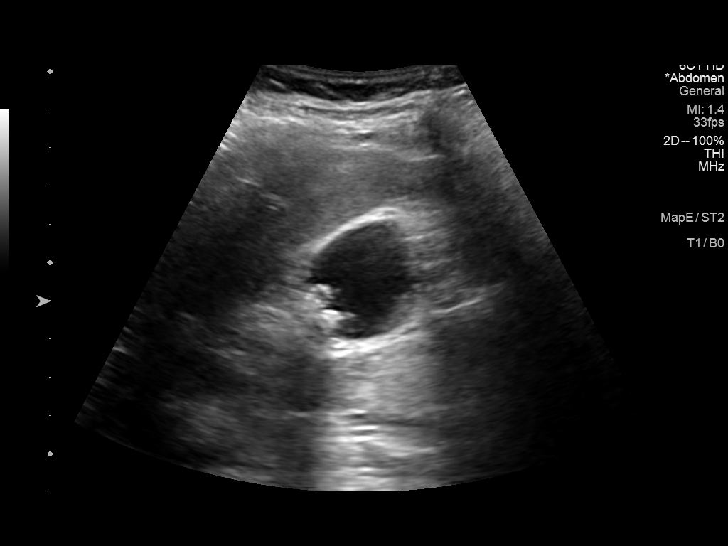
[im 10/40]
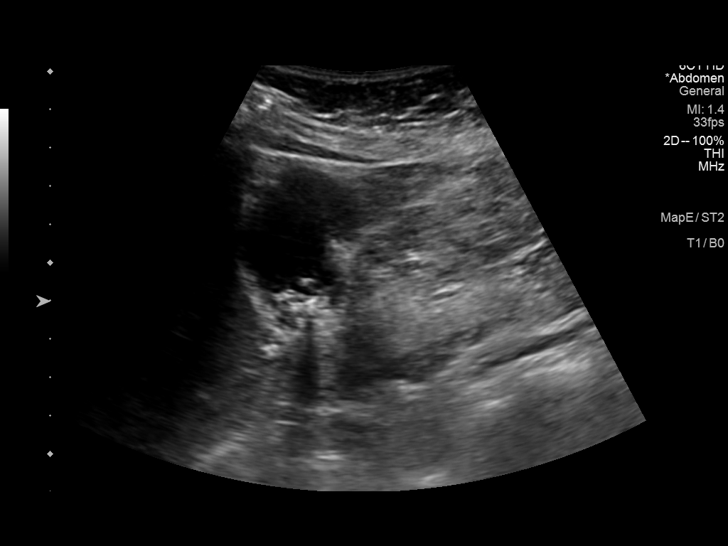
[im 14/40]
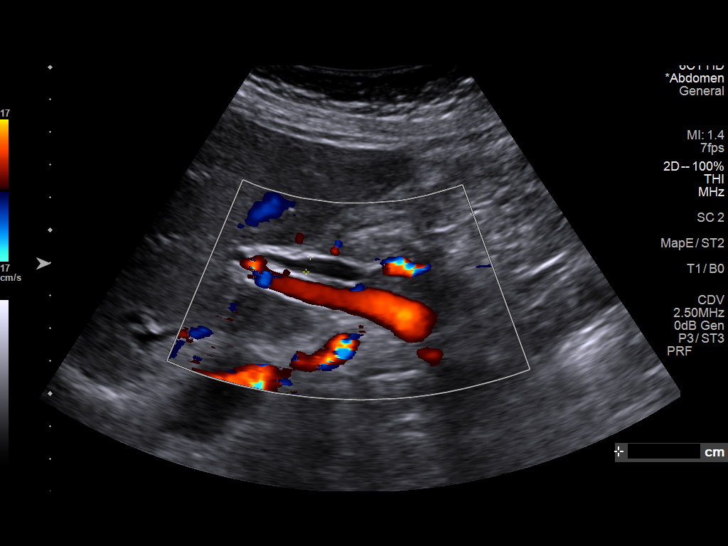
[im 15/40]
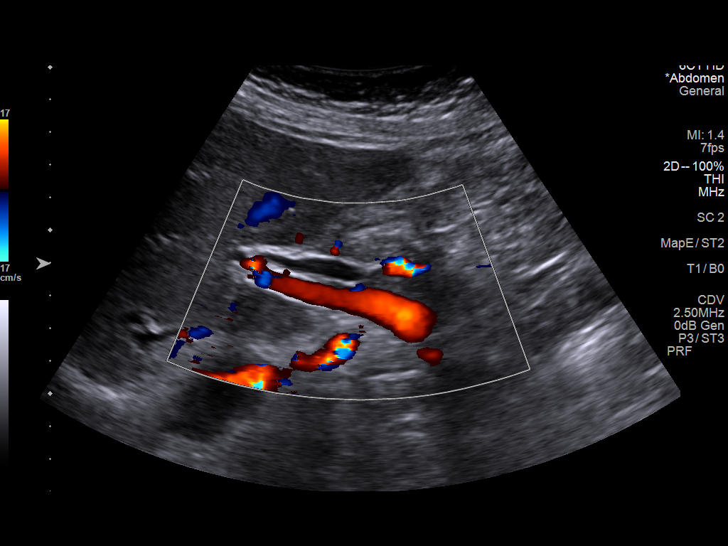
[im 18/40]
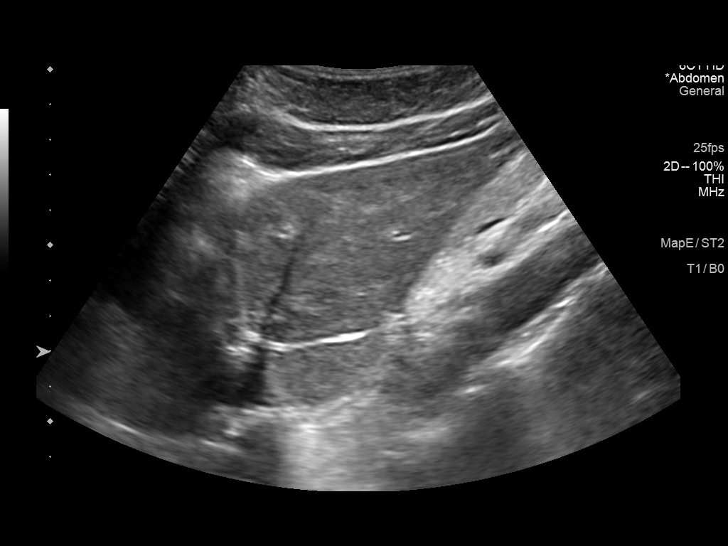
[im 22/40]
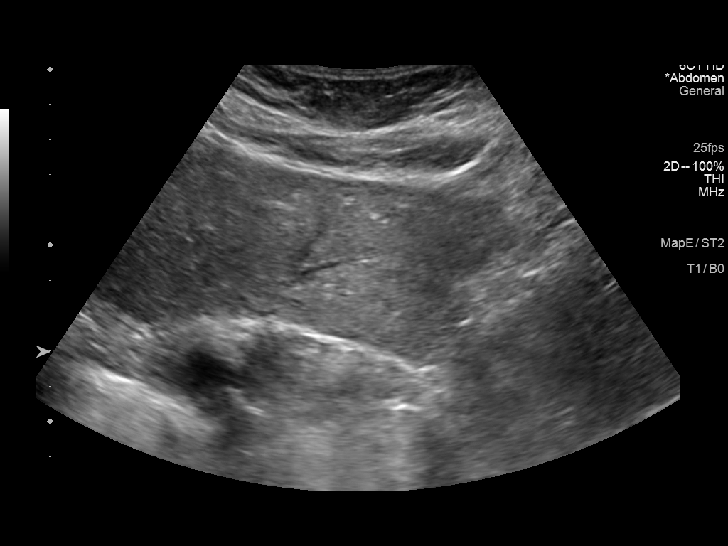
[im 25/40]
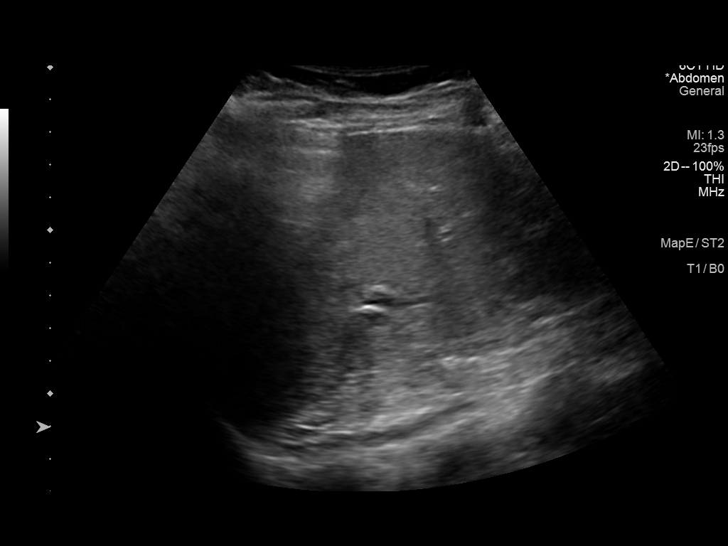
[im 27/40]
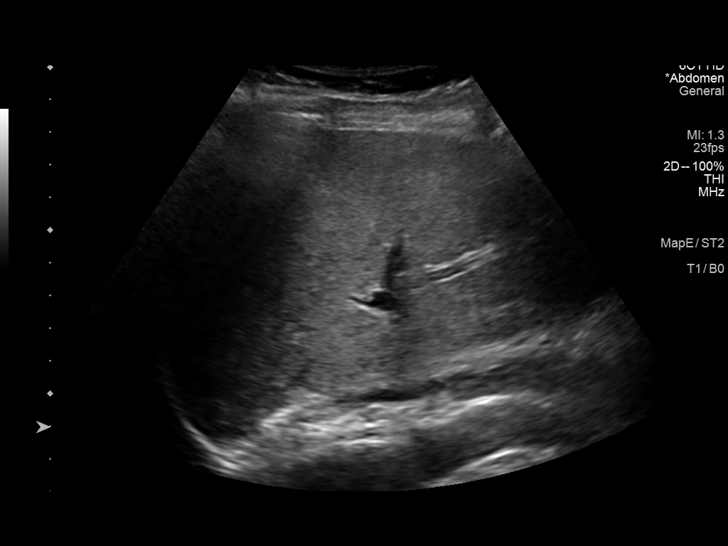
[im 30/40]
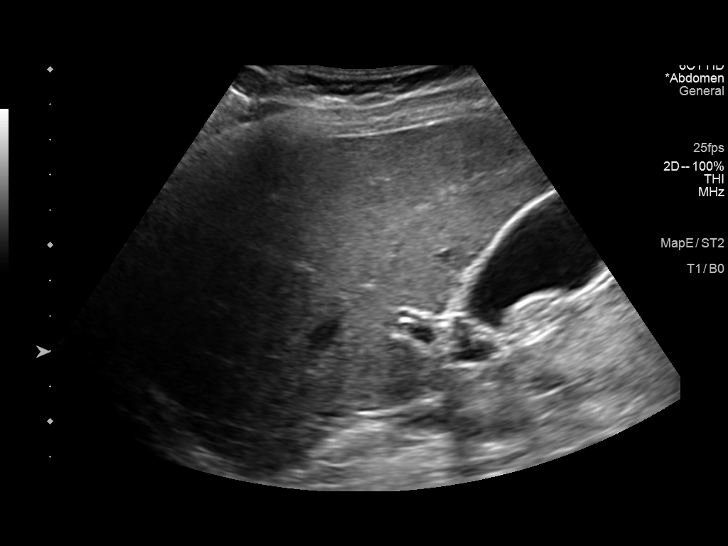
[im 33/40]
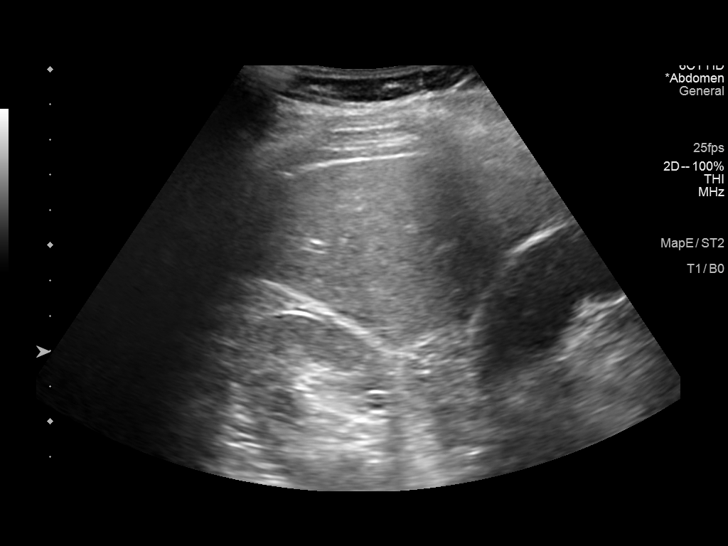
[im 36/40]
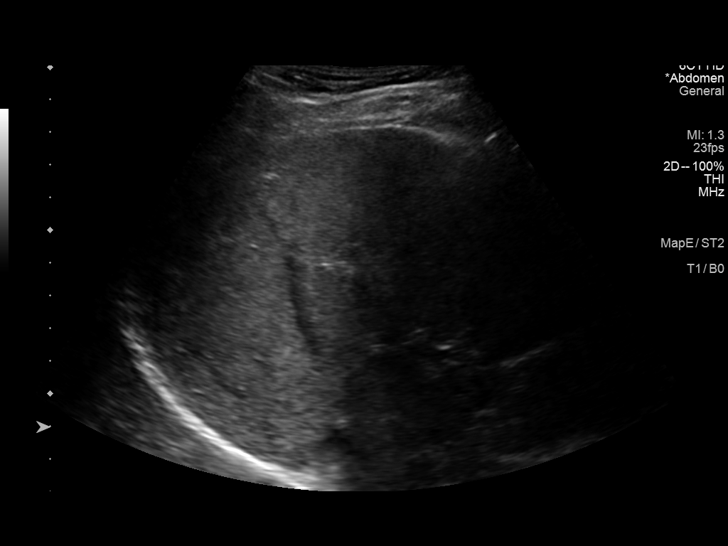
[im 40/40]
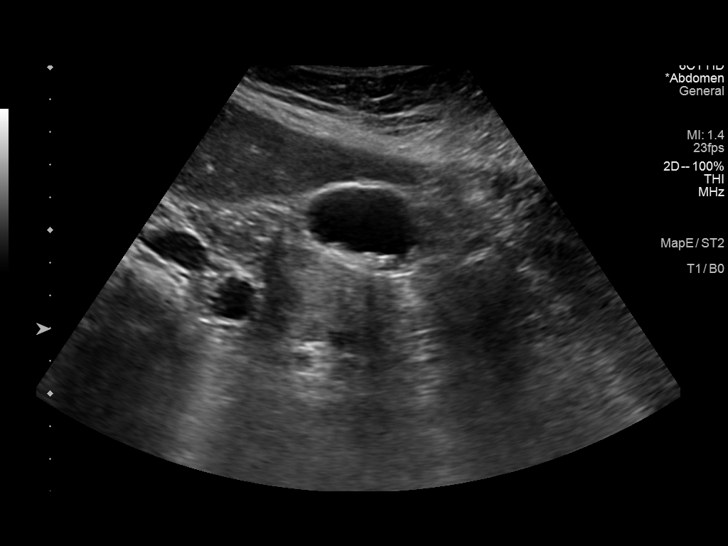

[14 of 25 positions shown; findings below may reference images not displayed]

FINDINGS: Gallbladder:

Multiple layering shadowing small gallstones in the gallbladder,
largest 5 mm. No definite gallbladder wall thickening. No
sonographic Murphy's sign. No pericholecystic fluid.

Common bile duct:

Diameter: 5 mm

Liver:

No focal lesion identified. Within normal limits in parenchymal
echogenicity. Portal vein is patent on color Doppler imaging with
normal direction of blood flow towards the liver.

Other: None.
IMPRESSION: 1. Cholelithiasis.  No evidence of acute cholecystitis.
2. No biliary ductal dilatation.
3. Normal liver.

## 2021-02-15 DIAGNOSIS — Z0000 Encounter for general adult medical examination without abnormal findings: Secondary | ICD-10-CM | POA: Diagnosis not present

## 2021-02-15 DIAGNOSIS — E7800 Pure hypercholesterolemia, unspecified: Secondary | ICD-10-CM | POA: Diagnosis not present

## 2021-06-23 DIAGNOSIS — J019 Acute sinusitis, unspecified: Secondary | ICD-10-CM | POA: Diagnosis not present

## 2021-10-18 DIAGNOSIS — R928 Other abnormal and inconclusive findings on diagnostic imaging of breast: Secondary | ICD-10-CM | POA: Diagnosis not present

## 2021-10-18 DIAGNOSIS — R921 Mammographic calcification found on diagnostic imaging of breast: Secondary | ICD-10-CM | POA: Diagnosis not present

## 2021-10-31 DIAGNOSIS — Z6825 Body mass index (BMI) 25.0-25.9, adult: Secondary | ICD-10-CM | POA: Diagnosis not present

## 2021-10-31 DIAGNOSIS — Z124 Encounter for screening for malignant neoplasm of cervix: Secondary | ICD-10-CM | POA: Diagnosis not present

## 2021-10-31 DIAGNOSIS — Z01419 Encounter for gynecological examination (general) (routine) without abnormal findings: Secondary | ICD-10-CM | POA: Diagnosis not present

## 2021-10-31 DIAGNOSIS — Z113 Encounter for screening for infections with a predominantly sexual mode of transmission: Secondary | ICD-10-CM | POA: Diagnosis not present

## 2022-01-01 DIAGNOSIS — N898 Other specified noninflammatory disorders of vagina: Secondary | ICD-10-CM | POA: Diagnosis not present

## 2022-03-27 DIAGNOSIS — Z0000 Encounter for general adult medical examination without abnormal findings: Secondary | ICD-10-CM | POA: Diagnosis not present

## 2022-03-27 DIAGNOSIS — E7800 Pure hypercholesterolemia, unspecified: Secondary | ICD-10-CM | POA: Diagnosis not present

## 2022-04-16 DIAGNOSIS — B3731 Acute candidiasis of vulva and vagina: Secondary | ICD-10-CM | POA: Diagnosis not present

## 2022-04-16 DIAGNOSIS — N898 Other specified noninflammatory disorders of vagina: Secondary | ICD-10-CM | POA: Diagnosis not present

## 2023-06-12 ENCOUNTER — Other Ambulatory Visit: Payer: Self-pay | Admitting: Nurse Practitioner

## 2023-06-12 DIAGNOSIS — R634 Abnormal weight loss: Secondary | ICD-10-CM

## 2023-06-18 ENCOUNTER — Encounter: Payer: Self-pay | Admitting: Nurse Practitioner

## 2023-07-01 ENCOUNTER — Ambulatory Visit
Admission: RE | Admit: 2023-07-01 | Discharge: 2023-07-01 | Disposition: A | Discharge status: Home / Self Care | Payer: Medicare Other | Source: Ambulatory Visit | Attending: Nurse Practitioner | Admitting: Nurse Practitioner

## 2023-07-01 DIAGNOSIS — R634 Abnormal weight loss: Secondary | ICD-10-CM

## 2023-07-01 MED ORDER — IOPAMIDOL (ISOVUE-300) INJECTION 61%
500.0000 mL | Freq: Once | INTRAVENOUS | Status: AC | PRN
  Administered 2023-07-01: 100 mL via INTRAVENOUS
